# Patient Record
Sex: Female | Born: 1965 | Race: White | Hispanic: No | Marital: Married | Smoking: Former smoker
Health system: Southern US, Community
[De-identification: ages and names within clinical notes are randomized; demographics above are authoritative.]

## PROBLEM LIST (undated history)

## (undated) DIAGNOSIS — C44310 Basal cell carcinoma of skin of unspecified parts of face: Secondary | ICD-10-CM

## (undated) DIAGNOSIS — F419 Anxiety disorder, unspecified: Secondary | ICD-10-CM

## (undated) DIAGNOSIS — D649 Anemia, unspecified: Secondary | ICD-10-CM

## (undated) DIAGNOSIS — J189 Pneumonia, unspecified organism: Secondary | ICD-10-CM

## (undated) DIAGNOSIS — I219 Acute myocardial infarction, unspecified: Secondary | ICD-10-CM

## (undated) HISTORY — PX: POLYPECTOMY: SHX149

## (undated) HISTORY — DX: Anemia, unspecified: D64.9

## (undated) HISTORY — PX: OTHER SURGICAL HISTORY: SHX169

## (undated) HISTORY — DX: Pneumonia, unspecified organism: J18.9

## (undated) HISTORY — DX: Anxiety disorder, unspecified: F41.9

## (undated) HISTORY — PX: ENDOMETRIAL ABLATION W/ NOVASURE: SUR434

## (undated) HISTORY — DX: Acute myocardial infarction, unspecified: I21.9

## (undated) HISTORY — PX: DILATATION & CURRETTAGE/HYSTEROSCOPY WITH RESECTOCOPE: SHX5572

## (undated) HISTORY — DX: Basal cell carcinoma of skin of unspecified parts of face: C44.310

---

## 1985-04-14 HISTORY — PX: BRAIN SURGERY: SHX531

## 1998-07-06 ENCOUNTER — Other Ambulatory Visit: Admission: RE | Admit: 1998-07-06 | Discharge: 1998-07-06 | Payer: Self-pay | Admitting: Gynecology

## 1999-11-20 ENCOUNTER — Ambulatory Visit: Admission: RE | Admit: 1999-11-20 | Discharge: 1999-11-20 | Payer: Self-pay | Admitting: Internal Medicine

## 2000-02-03 ENCOUNTER — Other Ambulatory Visit: Admission: RE | Admit: 2000-02-03 | Discharge: 2000-02-03 | Payer: Self-pay | Admitting: Gynecology

## 2001-03-15 ENCOUNTER — Other Ambulatory Visit: Admission: RE | Admit: 2001-03-15 | Discharge: 2001-03-15 | Payer: Self-pay | Admitting: Gynecology

## 2003-04-25 ENCOUNTER — Other Ambulatory Visit: Admission: RE | Admit: 2003-04-25 | Discharge: 2003-04-25 | Payer: Self-pay | Admitting: Gynecology

## 2003-06-01 ENCOUNTER — Ambulatory Visit (HOSPITAL_BASED_OUTPATIENT_CLINIC_OR_DEPARTMENT_OTHER): Admission: RE | Admit: 2003-06-01 | Discharge: 2003-06-01 | Payer: Self-pay | Admitting: Gynecology

## 2003-06-01 ENCOUNTER — Ambulatory Visit (HOSPITAL_COMMUNITY): Admission: RE | Admit: 2003-06-01 | Discharge: 2003-06-01 | Payer: Self-pay | Admitting: Gynecology

## 2004-04-30 ENCOUNTER — Other Ambulatory Visit: Admission: RE | Admit: 2004-04-30 | Discharge: 2004-04-30 | Payer: Self-pay | Admitting: Gynecology

## 2005-05-01 ENCOUNTER — Other Ambulatory Visit: Admission: RE | Admit: 2005-05-01 | Discharge: 2005-05-01 | Payer: Self-pay | Admitting: Gynecology

## 2014-12-18 ENCOUNTER — Ambulatory Visit (INDEPENDENT_AMBULATORY_CARE_PROVIDER_SITE_OTHER): Payer: Self-pay | Admitting: Family Medicine

## 2014-12-18 ENCOUNTER — Ambulatory Visit (INDEPENDENT_AMBULATORY_CARE_PROVIDER_SITE_OTHER): Payer: Self-pay

## 2014-12-18 VITALS — BP 122/78 | HR 89 | Temp 98.2°F | Resp 14 | Ht 65.0 in | Wt 189.0 lb

## 2014-12-18 DIAGNOSIS — M25572 Pain in left ankle and joints of left foot: Secondary | ICD-10-CM

## 2014-12-18 DIAGNOSIS — S92302A Fracture of unspecified metatarsal bone(s), left foot, initial encounter for closed fracture: Secondary | ICD-10-CM

## 2014-12-18 NOTE — Patient Instructions (Addendum)
Wear cam walker  Take ibuprofen or Aleve if needed for pain  Elevate foot if any swelling is occurring  Use crutches  Referral is being made to orthopedics. You should hear from this some time by midweek.  Advise a follow-up bone density sometime in the coming months  Return if further pain or problems

## 2014-12-18 NOTE — Progress Notes (Signed)
Left foot pain Subjective:  Patient ID: Shawna Frost, female    DOB: [birth year]  Age: 49 y.o. MRN: [id number]  For the past 2 weeks this lady has been having pain in the left foot distal old second and third metatarsal area. Knows of no specific injury. Wonders whether she might have a stress fracture, since she had one some time ago on the right foot. She spends her time chasing a 52-year-old. She is a massage therapist. Has been wearing a Cam Walker much of the time the last 2 weeks.   Objective:   No acute distress. Foot appears grossly normal. Is tender in the distal second and third metatarsals left foot. Some pain also on palpation underneath the ball of the foot.  Assessment & Plan:   Assessment:  Foot pain  Plan:  X-ray left foot  [facility] reading (PRIMARY) by  Dr. Linna Darner Fracture second left metatarsal , slightly out of alignment  Refer to ortho..  Cam walker short    Patient Instructions  Wear cam walker  Take ibuprofen or Aleve if needed for pain  Elevate foot if any swelling is occurring  Use crutches  Referral is being made to orthopedics. You should hear from this some time by midweek.  Advise a follow-up bone density sometime in the coming months  Return if further pain or problems     HOPPER,DAVID, MD 12/18/2014

## 2015-08-22 ENCOUNTER — Encounter: Payer: Self-pay | Admitting: Gastroenterology

## 2015-10-25 ENCOUNTER — Ambulatory Visit (AMBULATORY_SURGERY_CENTER): Payer: Self-pay | Admitting: *Deleted

## 2015-10-25 VITALS — Ht 65.0 in | Wt 194.8 lb

## 2015-10-25 DIAGNOSIS — Z1211 Encounter for screening for malignant neoplasm of colon: Secondary | ICD-10-CM

## 2015-10-25 MED ORDER — SUPREP BOWEL PREP KIT 17.5-3.13-1.6 GM/177ML PO SOLN: 1.0000 | Freq: Once | ORAL | Status: DC

## 2015-10-25 NOTE — Progress Notes (Signed)
Patient denies any allergies to egg or soy products. Patient denies complications with anesthesia/sedation.  Patient denies oxygen use at home and denies diet medications. Emmi instructions for colonoscopy explained but patient denied.  Denied pamphlet.

## 2015-11-08 ENCOUNTER — Encounter: Payer: Self-pay | Admitting: Gastroenterology

## 2015-11-08 ENCOUNTER — Ambulatory Visit (AMBULATORY_SURGERY_CENTER): Payer: Self-pay | Admitting: Gastroenterology

## 2015-11-08 VITALS — BP 126/69 | HR 76 | Temp 98.4°F | Resp 16 | Ht 65.0 in | Wt 194.0 lb

## 2015-11-08 DIAGNOSIS — Z1211 Encounter for screening for malignant neoplasm of colon: Secondary | ICD-10-CM

## 2015-11-08 NOTE — Progress Notes (Signed)
A and O x3. Report to RN. Tolerated MAC anesthesia well. 

## 2015-11-08 NOTE — Op Note (Signed)
[facility] Patient Name: Shawna Frost Procedure Date: 11/08/2015 9:32 AM MRN: [id number] Endoscopist: Mauri Pole , MD Age: 50 Referring MD:  Date of Birth: Aug 13, 1965 Gender: Female Account #: [id number] Procedure:                Colonoscopy Indications:              Screening for colorectal malignant neoplasm, This                            is the patient's first colonoscopy Medicines:                Monitored Anesthesia Care Procedure:                Pre-Anesthesia Assessment:                           - Prior to the procedure, a History and Physical                            was performed, and patient medications and                            allergies were reviewed. The patient's tolerance of                            previous anesthesia was also reviewed. The risks                            and benefits of the procedure and the sedation                            options and risks were discussed with the patient.                            All questions were answered, and informed consent                            was obtained. Prior Anticoagulants: The patient has                            taken no previous anticoagulant or antiplatelet                            agents. ASA Grade Assessment: II - A patient with                            mild systemic disease. After reviewing the risks                            and benefits, the patient was deemed in                            satisfactory condition to undergo the procedure.  After obtaining informed consent, the colonoscope                            was passed under direct vision. Throughout the                            procedure, the patient's blood pressure, pulse, and                            oxygen saturations were monitored continuously. The                            Model CF-HQ190L [id number] scope was introduced                            through the anus  and advanced to the the terminal                            ileum, with identification of the appendiceal                            orifice and IC valve. The colonoscopy was performed                            without difficulty. The patient tolerated the                            procedure well. The quality of the bowel                            preparation was good. The terminal ileum, ileocecal                            valve, appendiceal orifice, and rectum were                            photographed. Scope In: 9:50:26 AM Scope Out: 10:00:27 AM Scope Withdrawal Time: 0 hours 5 minutes 11 seconds  Total Procedure Duration: 0 hours 10 minutes 1 second  Findings:                 The perianal and digital rectal examinations were                            normal.                           A few small-mouthed diverticula were found in the                            sigmoid colon.                           Non-bleeding internal hemorrhoids were found during  retroflexion. The hemorrhoids were small.                           The exam was otherwise without abnormality. Complications:            No immediate complications. Estimated Blood Loss:     Estimated blood loss: none. Impression:               - Diverticulosis in the sigmoid colon.                           - Non-bleeding internal hemorrhoids.                           - The examination was otherwise normal.                           - No specimens collected. Recommendation:           - Patient has a contact number available for                            emergencies. The signs and symptoms of potential                            delayed complications were discussed with the                            patient. Return to normal activities tomorrow.                            Written discharge instructions were provided to the                            patient.                           - Resume  previous diet.                           - Continue present medications.                           - Repeat colonoscopy in 10 years for screening                            purposes. Mauri Pole, MD 11/08/2015 10:04:01 AM This report has been signed electronically.

## 2015-11-08 NOTE — Progress Notes (Signed)
Pt has a perforated septum and septum.  She put bacitracion ointment in her nose this am. maw

## 2015-11-08 NOTE — Patient Instructions (Signed)
YOU HAD AN ENDOSCOPIC PROCEDURE TODAY AT THE [facility]:   Refer to the procedure report that was given to you for any specific questions about what was found during the examination.  If the procedure report does not answer your questions, please call your gastroenterologist to clarify.  If you requested that your care partner not be given the details of your procedure findings, then the procedure report has been included in a sealed envelope for you to review at your convenience later.  YOU SHOULD EXPECT: Some feelings of bloating in the abdomen. Passage of more gas than usual.  Walking can help get rid of the air that was put into your GI tract during the procedure and reduce the bloating. If you had a lower endoscopy (such as a colonoscopy or flexible sigmoidoscopy) you may notice spotting of blood in your stool or on the toilet paper. If you underwent a bowel prep for your procedure, you may not have a normal bowel movement for a few days.  Please Note:  You might notice some irritation and congestion in your nose or some drainage.  This is from the oxygen used during your procedure.  There is no need for concern and it should clear up in a day or so.  SYMPTOMS TO REPORT IMMEDIATELY:   Following lower endoscopy (colonoscopy or flexible sigmoidoscopy):  Excessive amounts of blood in the stool  Significant tenderness or worsening of abdominal pains  Swelling of the abdomen that is new, acute  Fever of 100F or higher   For urgent or emergent issues, a gastroenterologist can be reached at any hour by calling [phone].   DIET: Your first meal following the procedure should be a small meal and then it is ok to progress to your normal diet. Heavy or fried foods are harder to digest and may make you feel nauseous or bloated.  Likewise, meals heavy in dairy and vegetables can increase bloating.  Drink plenty of fluids but you should avoid alcoholic beverages for 24  hours.  ACTIVITY:  You should plan to take it easy for the rest of today and you should NOT DRIVE or use heavy machinery until tomorrow (because of the sedation medicines used during the test).    FOLLOW UP: Our staff will call the number listed on your records the next business day following your procedure to check on you and address any questions or concerns that you may have regarding the information given to you following your procedure. If we do not reach you, we will leave a message.  However, if you are feeling well and you are not experiencing any problems, there is no need to return our call.  We will assume that you have returned to your regular daily activities without incident.  If any biopsies were taken you will be contacted by phone or by letter within the next 1-3 weeks.  Please call us at [phone] if you have not heard about the biopsies in 3 weeks.    SIGNATURES/CONFIDENTIALITY: You and/or your care partner have signed paperwork which will be entered into your electronic medical record.  These signatures attest to the fact that that the information above on your After Visit Summary has been reviewed and is understood.  Full responsibility of the confidentiality of this discharge information lies with you and/or your care-partner. 

## 2015-11-09 ENCOUNTER — Telehealth: Payer: Self-pay | Admitting: *Deleted

## 2015-11-09 NOTE — Telephone Encounter (Signed)
  Follow up Call-  Call back number 11/08/2015  Post procedure Call Back phone  # [phone] cell  Permission to leave phone message Yes  Some recent data might be hidden     Patient questions:  Do you have a fever, pain , or abdominal swelling? No. Pain Score  0 *  Have you tolerated food without any problems? Yes.    Have you been able to return to your normal activities? Yes.    Do you have any questions about your discharge instructions: Diet   No. Medications  No. Follow up visit  No.  Do you have questions or concerns about your Care? No.  Actions: * If pain score is 4 or above: No action needed, pain <4.

## 2016-07-29 IMAGING — CR DG FOOT COMPLETE 3+V*L*
3 series · 3 of 3 positions shown · non-contrast
Comparison: None.

CLINICAL DATA: Left foot pain for the past 2 weeks. No known
injury.

EXAM:
LEFT FOOT - COMPLETE 3+ VIEW

[AP]
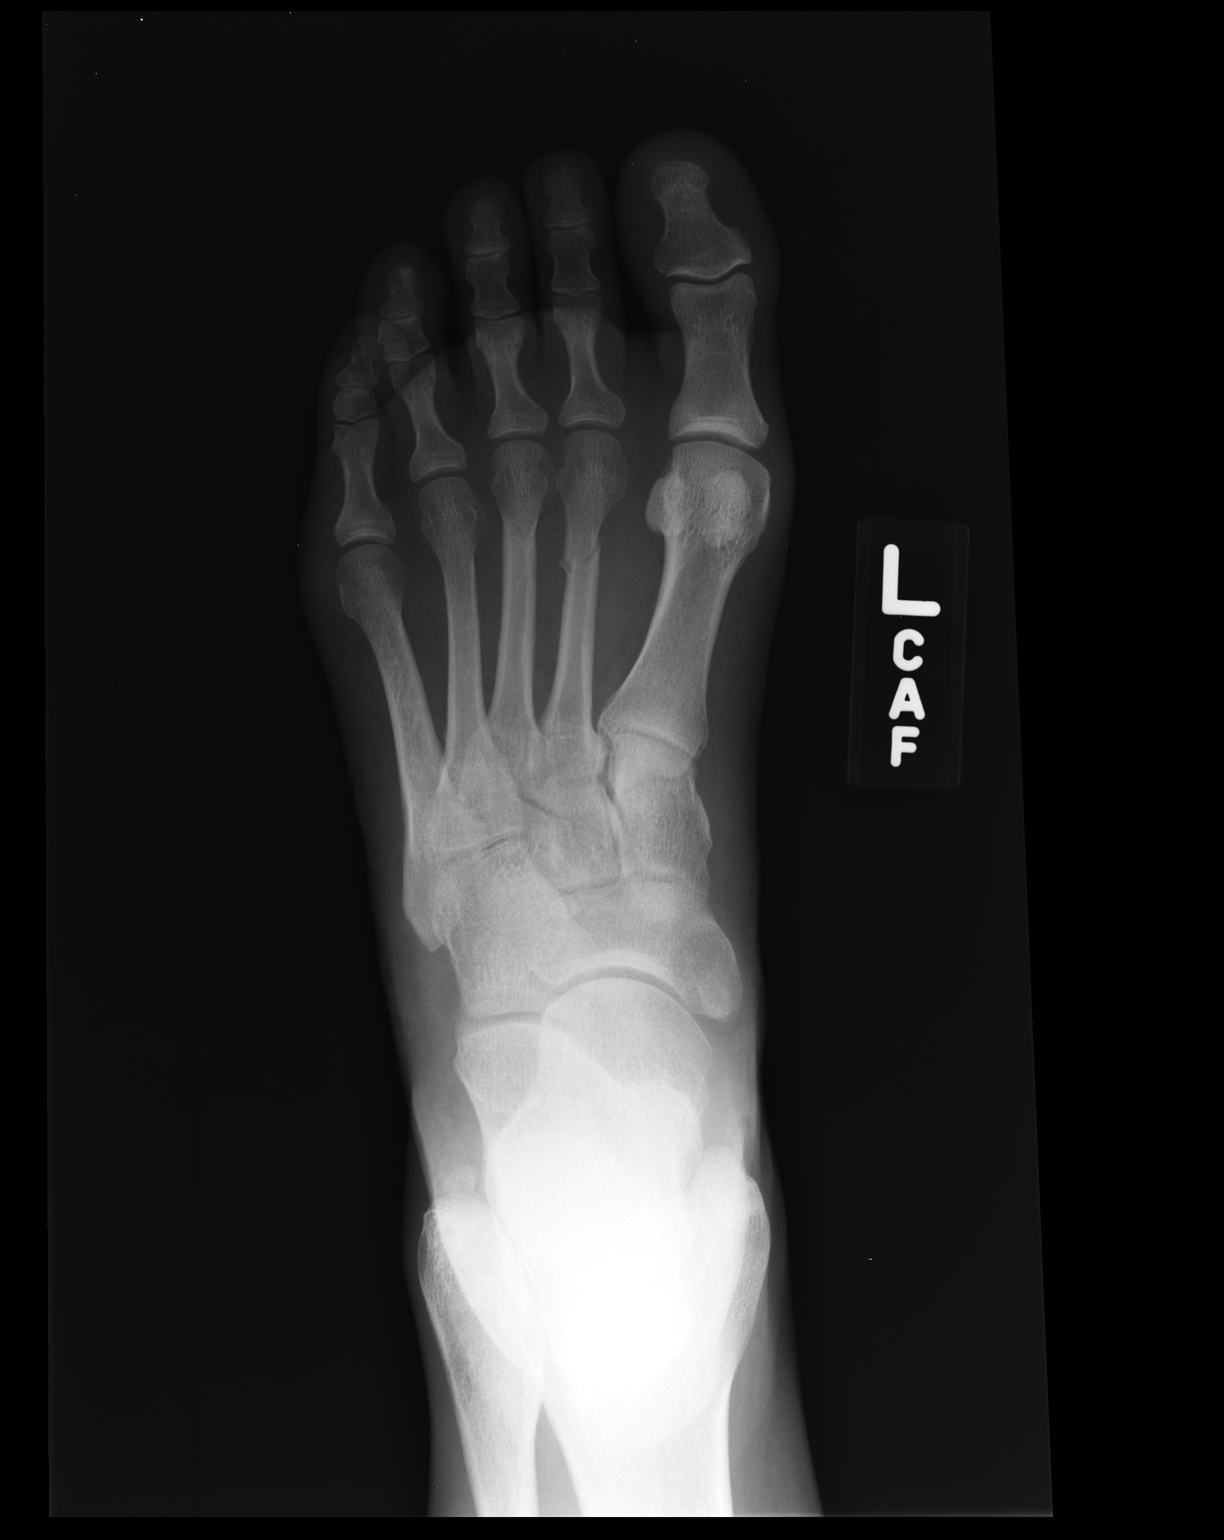

[ap obl int rot]
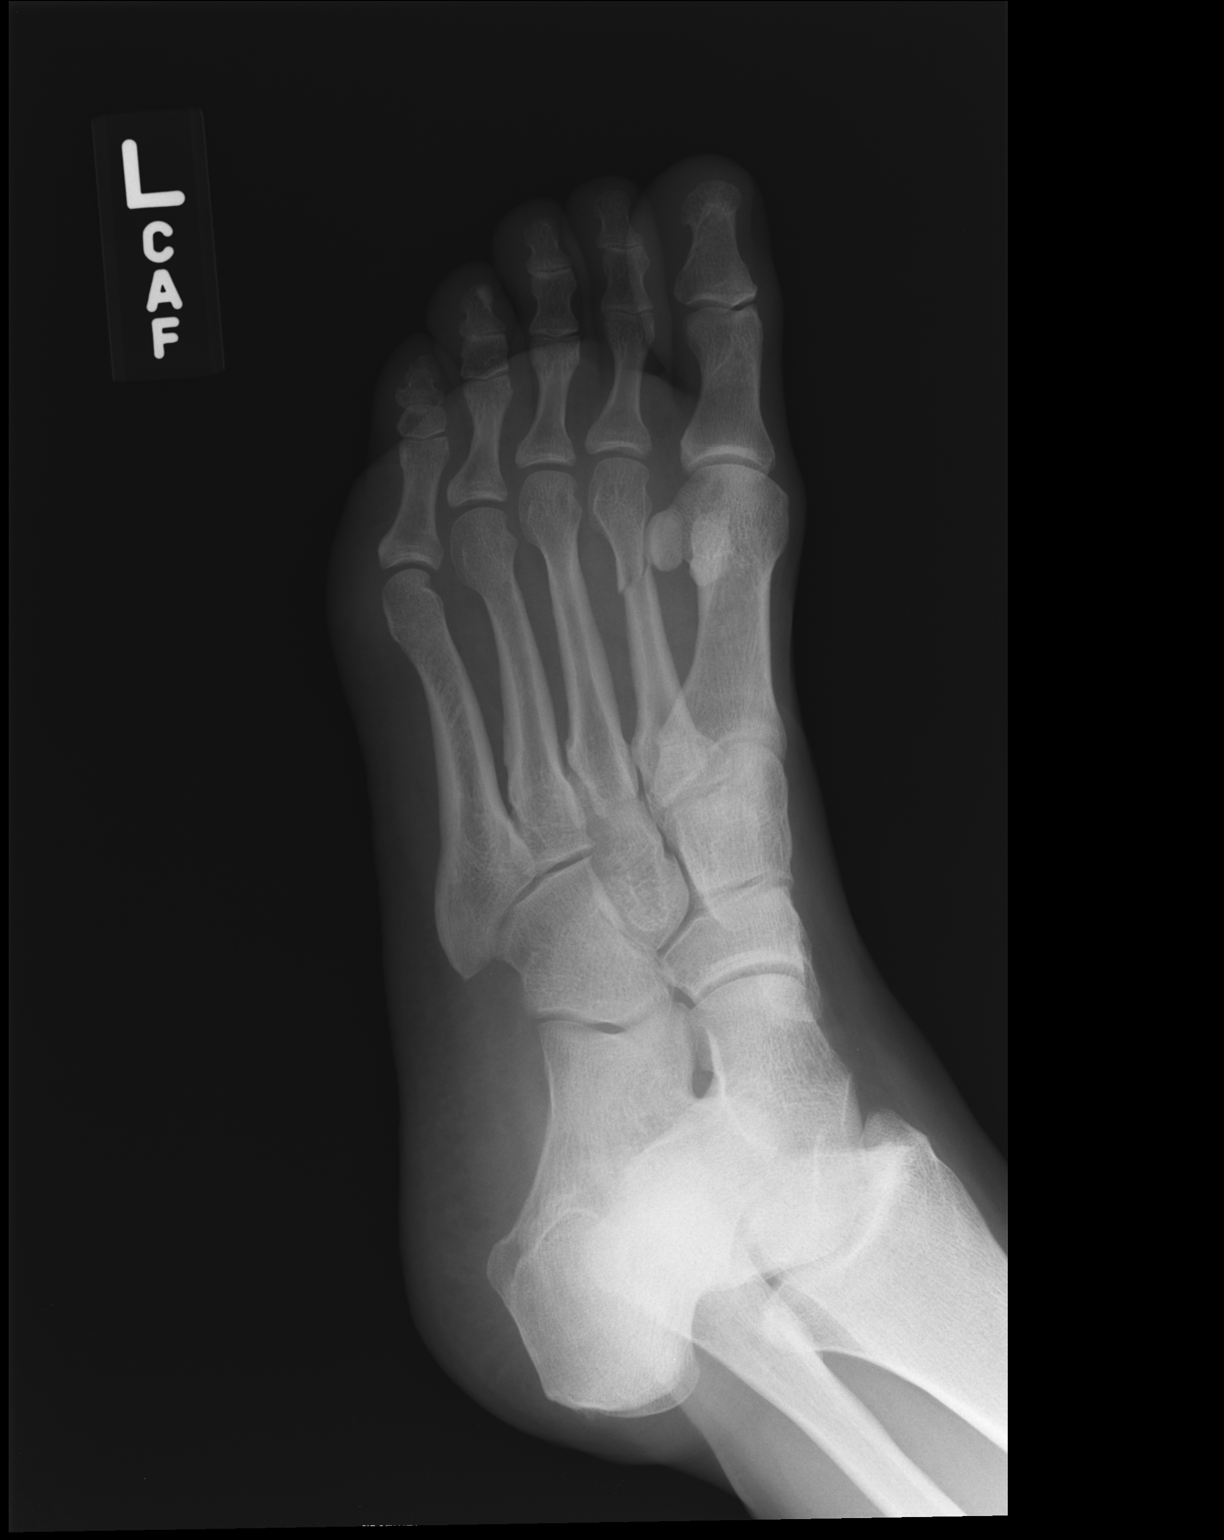

[lateral]
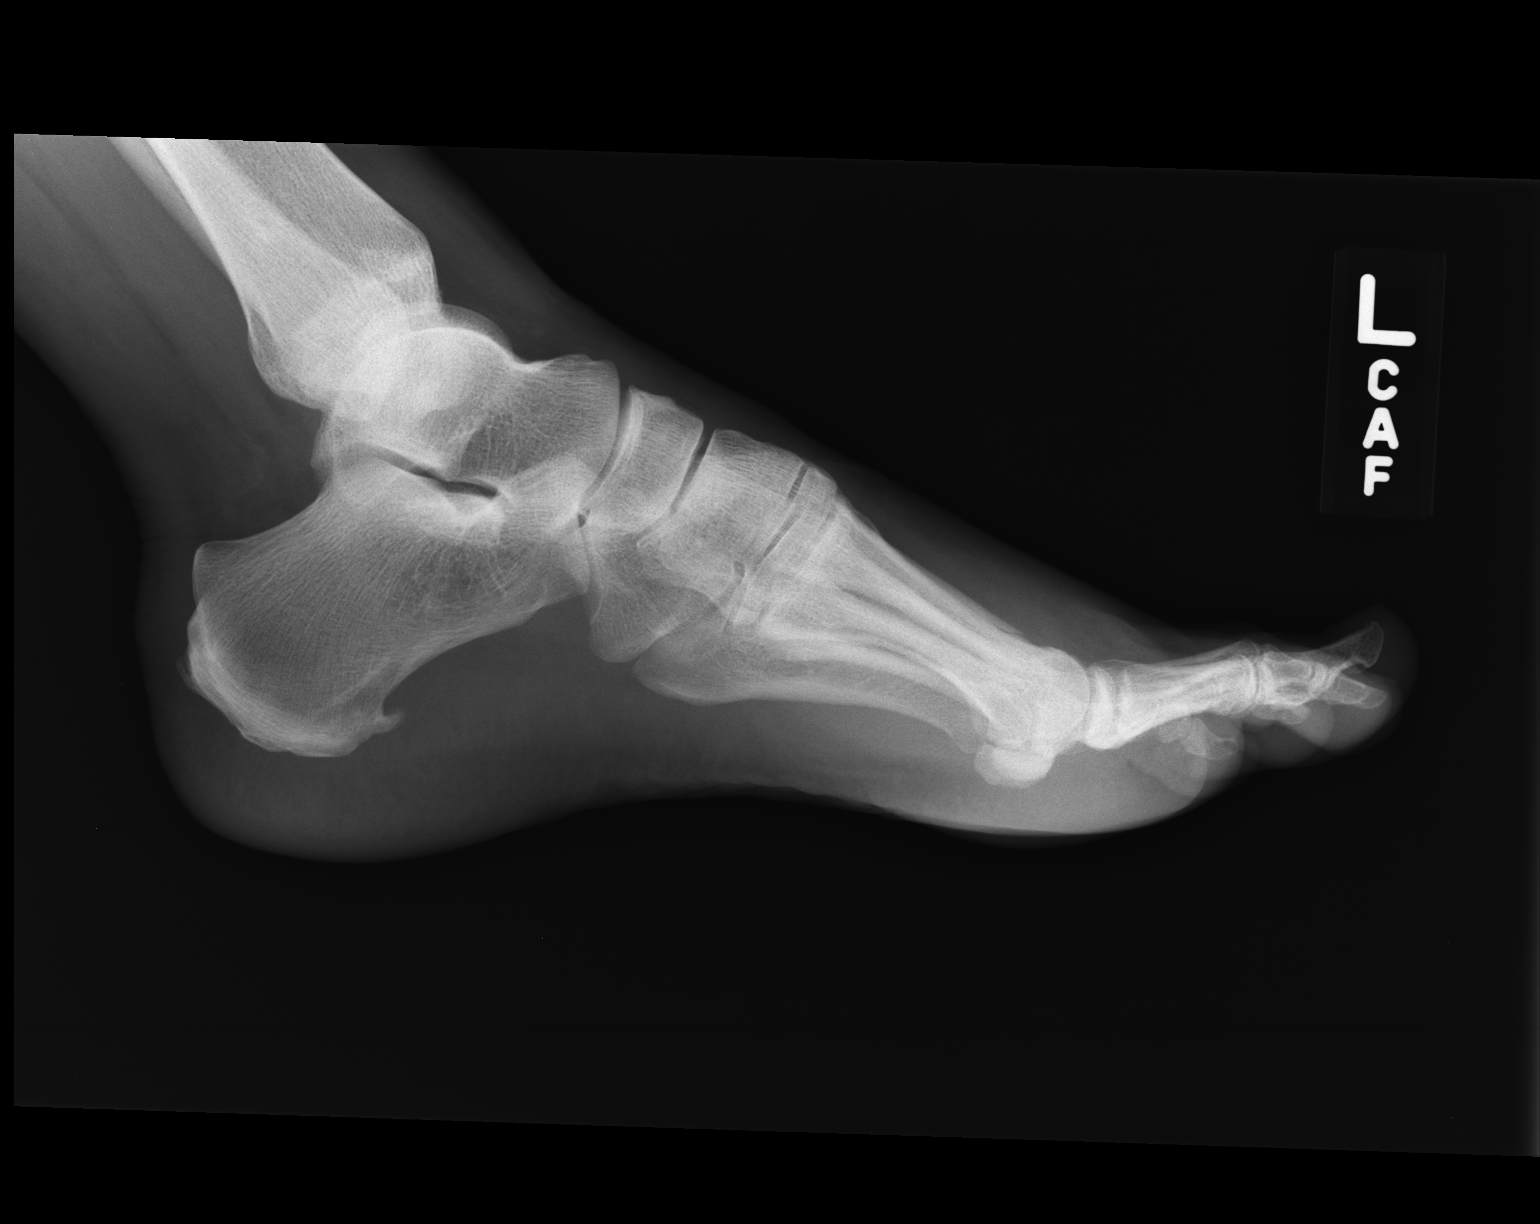

[3 of 3 positions shown; findings below may reference images not displayed]

FINDINGS: Oblique fracture of the distal shaft of the second metatarsal. This
is mildly comminuted with minimal lateral and ventral displacement
of the distal fragment without significant angulation.
Moderate-sized inferior calcaneal spur.
IMPRESSION: Minimally displaced and mildly comminuted second metatarsal
fracture.

## 2016-09-15 ENCOUNTER — Ambulatory Visit (HOSPITAL_COMMUNITY)
Admission: EM | Admit: 2016-09-15 | Discharge: 2016-09-15 | Disposition: A | Discharge status: Other Acute Inpatient Hospital | Payer: Self-pay

## 2020-01-08 ENCOUNTER — Other Ambulatory Visit: Payer: Self-pay | Admitting: Adult Health

## 2020-01-08 ENCOUNTER — Ambulatory Visit (HOSPITAL_COMMUNITY)
Admission: RE | Admit: 2020-01-08 | Discharge: 2020-01-08 | Disposition: A | Discharge status: Home / Self Care | Payer: HRSA Program | Source: Ambulatory Visit | Attending: Pulmonary Disease | Admitting: Pulmonary Disease

## 2020-01-08 DIAGNOSIS — U071 COVID-19: Secondary | ICD-10-CM

## 2020-01-08 MED ORDER — ALBUTEROL SULFATE HFA 108 (90 BASE) MCG/ACT IN AERS: 2.0000 | INHALATION_SPRAY | Freq: Once | RESPIRATORY_TRACT | Status: DC | PRN

## 2020-01-08 MED ORDER — EPINEPHRINE 0.3 MG/0.3ML IJ SOAJ: 0.3000 mg | Freq: Once | INTRAMUSCULAR | Status: DC | PRN

## 2020-01-08 MED ORDER — SODIUM CHLORIDE 0.9 % IV SOLN
1200.0000 mg | Freq: Once | INTRAVENOUS | Status: AC
  Administered 2020-01-08: 1200 mg via INTRAVENOUS

## 2020-01-08 MED ORDER — DIPHENHYDRAMINE HCL 50 MG/ML IJ SOLN: 50.0000 mg | Freq: Once | INTRAMUSCULAR | Status: DC | PRN

## 2020-01-08 MED ORDER — METHYLPREDNISOLONE SODIUM SUCC 125 MG IJ SOLR: 125.0000 mg | Freq: Once | INTRAMUSCULAR | Status: DC | PRN

## 2020-01-08 MED ORDER — SODIUM CHLORIDE 0.9 % IV SOLN: INTRAVENOUS | Status: DC | PRN

## 2020-01-08 MED ORDER — FAMOTIDINE IN NACL 20-0.9 MG/50ML-% IV SOLN: 20.0000 mg | Freq: Once | INTRAVENOUS | Status: DC | PRN

## 2020-01-08 NOTE — Discharge Instructions (Signed)

## 2020-01-08 NOTE — Progress Notes (Signed)
  Diagnosis: COVID-19  Physician: Dr. Joya Gaskins  Procedure: [facility] Med: casirivimab\imdevimab infusion - Provided patient with casirivimab\imdevimab fact sheet for patients, parents and caregivers prior to infusion.  Complications: No immediate complications noted.  Discharge: Discharged home   Tia Masker 01/08/2020

## 2020-01-08 NOTE — Progress Notes (Signed)
I connected by phone with Shawna Frost on 01/08/2020 at 9:28 AM to discuss the potential use of a new treatment for mild to moderate COVID-19 viral infection in non-hospitalized patients.  This patient is a 54 y.o. female that meets the FDA criteria for Emergency Use Authorization of COVID monoclonal antibody casirivimab/imdevimab or bamlanivimab/eteseviamb.  Has a (+) direct SARS-CoV-2 viral test result  Has mild or moderate COVID-19   Is NOT hospitalized due to COVID-19  Is within 10 days of symptom onset  Has at least one of the high risk factor(s) for progression to severe COVID-19 and/or hospitalization as defined in EUA.  Specific high risk criteria : BMI > 25   I have spoken and communicated the following to the patient or parent/caregiver regarding COVID monoclonal antibody treatment:  1. FDA has authorized the emergency use for the treatment of mild to moderate COVID-19 in adults and pediatric patients with positive results of direct SARS-CoV-2 viral testing who are 73 years of age and older weighing at least 40 kg, and who are at high risk for progressing to severe COVID-19 and/or hospitalization.  2. The significant known and potential risks and benefits of COVID monoclonal antibody, and the extent to which such potential risks and benefits are unknown.  3. Information on available alternative treatments and the risks and benefits of those alternatives, including clinical trials.  4. Patients treated with COVID monoclonal antibody should continue to self-isolate and use infection control measures (e.g., wear mask, isolate, social distance, avoid sharing personal items, clean and disinfect "high touch" surfaces, and frequent handwashing) according to CDC guidelines.   5. The patient or parent/caregiver has the option to accept or refuse COVID monoclonal antibody treatment.  After reviewing this information with the patient, the patient has agreed to receive one of the  available covid 19 monoclonal antibodies and will be provided an appropriate fact sheet prior to infusion.  Set up for 9/26 at 1230  Will bring copy of test results   Rexene Edison, NP 01/08/2020 9:28 AM

## 2021-05-21 NOTE — Progress Notes (Signed)
° °  ARIELE VIDRIO 1965/12/20 [id number]   History: Postmenopausal 56 y.o. G 2P0 presents for annual exam.  Gynecologic History Sexually active: yes Hormone replacement therapy: no Symptoms controlled: yes  Health Maintenance Last Pap: 2018. Results were: normal  Last mammogram: 2022. Results were:normal  Last colonoscopy: 2016. Results were: normal Last Dexa: never  Past medical history, past surgical history, family history and social history were all reviewed and documented in the EPIC chart.  ROS:  A ROS was performed and pertinent positives and negatives are included. Review of Systems - Negative except   Genito-Urinary ROS: positive for - vaginal dryness, decreased libido  Exam:  Vitals:   05/22/21 1124  BP: 136/84  Weight: 174 lb (78.9 kg)  Height: 5' 4.5" (1.638 m)   Body mass index is 29.41 kg/m.  General appearance:  Normal Thyroid:  Symmetrical, normal in size, without palpable masses or nodularity. Respiratory  Auscultation:  Clear without wheezing or rhonchi Cardiovascular  Auscultation:  Regular rate, without rubs, murmurs or gallops  Edema/varicosities:  Not grossly evident Abdominal  Soft,nontender, without masses, guarding or rebound.  Liver/spleen:  No organomegaly noted  Hernia:  None appreciated  Skin  Inspection:  Grossly normal Breasts: Examined lying and sitting.   Right: Without masses, retractions, nipple discharge or axillary adenopathy.   Left: Without masses, retractions, nipple discharge or axillary adenopathy. Genitourinary   Inguinal/mons:  Normal without inguinal adenopathy  External genitalia:  Normal appearing vulva with no masses, tenderness, or lesions  BUS/Urethra/Skene's glands:  Normal  Vagina:  Normal appearing with normal color and discharge, no lesions. Atrophy: moderate Prolapse: none  Cervix:  Normal appearing without discharge or lesions  Uterus:  Normal in size, shape and contour.  Midline and mobile,  nontender  Adnexa/parametria:     Rt: Normal in size, without masses or tenderness.   Lt: Normal in size, without masses or tenderness.  Anus and perineum: Normal  Digital rectal exam: Normal sphincter tone without palpated masses or tenderness  Patient informed chaperone available to be present for breast and pelvic exam. Patient has requested no chaperone to be present. Patient has been advised what will be completed during breast and pelvic exam.   Assessment/Plan:  56 y.o. for annual exam.   Schedule yearly mammo Pap with cotesting today Vaginal atrophy- begin yuvafem twice/week at bedtime Schedule DEXA  Discussed SBE, colonoscopy and DEXA screening as directed. Recommend 149mins of exercise weekly, including weight bearing exercise. Encouraged the use of seatbelts and sunscreen.  Return in 1 year for annual or sooner prn.  Kerry Dory WHNP-BC, 11:57 AM 05/22/2021

## 2021-05-22 ENCOUNTER — Encounter: Payer: Self-pay | Admitting: Radiology

## 2021-05-22 ENCOUNTER — Ambulatory Visit (INDEPENDENT_AMBULATORY_CARE_PROVIDER_SITE_OTHER): Payer: Self-pay | Admitting: Radiology

## 2021-05-22 ENCOUNTER — Other Ambulatory Visit (HOSPITAL_COMMUNITY)
Admission: RE | Admit: 2021-05-22 | Discharge: 2021-05-22 | Disposition: A | Discharge status: Home / Self Care | Payer: Self-pay | Source: Ambulatory Visit | Attending: Radiology | Admitting: Radiology

## 2021-05-22 ENCOUNTER — Other Ambulatory Visit: Payer: Self-pay

## 2021-05-22 VITALS — BP 136/84 | Ht 64.5 in | Wt 174.0 lb

## 2021-05-22 DIAGNOSIS — Z01419 Encounter for gynecological examination (general) (routine) without abnormal findings: Secondary | ICD-10-CM | POA: Insufficient documentation

## 2021-05-22 DIAGNOSIS — N952 Postmenopausal atrophic vaginitis: Secondary | ICD-10-CM

## 2021-05-22 MED ORDER — ESTRADIOL 10 MCG VA TABS: 1.0000 | ORAL_TABLET | VAGINAL | 11 refills | Status: AC

## 2021-05-23 ENCOUNTER — Other Ambulatory Visit: Payer: Self-pay | Admitting: *Deleted

## 2021-05-23 DIAGNOSIS — Z13820 Encounter for screening for osteoporosis: Secondary | ICD-10-CM

## 2021-05-24 LAB — CYTOLOGY - PAP
Comment: NEGATIVE
Diagnosis: NEGATIVE
Diagnosis: REACTIVE
High risk HPV: NEGATIVE

## 2021-07-08 ENCOUNTER — Other Ambulatory Visit: Payer: Self-pay | Admitting: Radiology

## 2021-07-08 ENCOUNTER — Other Ambulatory Visit: Payer: Self-pay

## 2021-07-08 ENCOUNTER — Ambulatory Visit (INDEPENDENT_AMBULATORY_CARE_PROVIDER_SITE_OTHER): Payer: Self-pay

## 2021-07-08 DIAGNOSIS — Z780 Asymptomatic menopausal state: Secondary | ICD-10-CM

## 2021-07-08 DIAGNOSIS — Z13820 Encounter for screening for osteoporosis: Secondary | ICD-10-CM

## 2021-08-16 ENCOUNTER — Encounter: Payer: Self-pay | Admitting: Radiology

## 2021-10-17 ENCOUNTER — Telehealth: Payer: Self-pay

## 2021-10-17 NOTE — Telephone Encounter (Signed)
Pt calling to report that she has recently been informed about a medication called "naltrexone" and is interested to see if it would work for her. Pt is wondering if you would be willing to Rx her 1-2 months of it to see. Please advise.   Last AEX 05/22/21

## 2021-10-17 NOTE — Telephone Encounter (Signed)
For alcohol abuse? This would need to come from her PCP

## 2021-10-17 NOTE — Telephone Encounter (Signed)
Returned call, no answer. Left detailed VM per DPR re: recommendations. Noted pt does not have PCP listed in chart. Offered to send her a referral to [facility] on [address] if needed. Asked her to return call and let us know.

## 2021-11-01 NOTE — Telephone Encounter (Signed)
No returned call. OK to close?

## 2021-11-01 NOTE — Telephone Encounter (Signed)
Ok to close

## 2022-08-25 ENCOUNTER — Encounter: Payer: Self-pay | Admitting: Radiology

## 2024-02-03 ENCOUNTER — Other Ambulatory Visit: Payer: Self-pay | Admitting: Obstetrics and Gynecology

## 2024-02-03 DIAGNOSIS — Z1231 Encounter for screening mammogram for malignant neoplasm of breast: Secondary | ICD-10-CM

## 2024-03-31 ENCOUNTER — Inpatient Hospital Stay: Admission: RE | Admit: 2024-03-31 | Payer: Self-pay

## 2024-03-31 ENCOUNTER — Other Ambulatory Visit: Payer: Self-pay

## 2024-03-31 ENCOUNTER — Ambulatory Visit: Payer: Self-pay | Admitting: *Deleted

## 2024-03-31 VITALS — BP 149/84 | Ht 64.5 in | Wt 172.0 lb

## 2024-03-31 DIAGNOSIS — Z1239 Encounter for other screening for malignant neoplasm of breast: Secondary | ICD-10-CM

## 2024-03-31 DIAGNOSIS — Z1211 Encounter for screening for malignant neoplasm of colon: Secondary | ICD-10-CM

## 2024-03-31 DIAGNOSIS — Z1231 Encounter for screening mammogram for malignant neoplasm of breast: Secondary | ICD-10-CM

## 2024-03-31 NOTE — Progress Notes (Signed)
 Ms. Shawna Frost is a 58 y.o. female who presents to [facility] clinic today with no complaints.    Pap Smear: Pap smear not completed today. Last Pap smear was 05/22/2021 at [facility] and was normal with negative HPV. Per patient has history of an abnormal Pap smear 30 years ago that a follow up Pap smear was completed that was normal. Patient stated all Pap smears have been normal since. Last Pap smear result is available in Epic.   Physical exam: Breasts Breasts symmetrical. No skin abnormalities bilateral breasts. No nipple retraction bilateral breasts. No nipple discharge bilateral breasts. No lymphadenopathy. No lumps palpated bilateral breasts. No complaints of pain or tenderness on exam.  Pelvic/Bimanual Pap is not indicated today per BCCCP guidelines.   Smoking History: Patient is a former smoker that smoked socially for 2-3 years and quit 40 years ago.   Patient Navigation: Patient education provided. Access to services provided for patient through [facility] program.   Colorectal Cancer Screening: Per patient has had colonoscopy completed on 11/08/2015 at [facility]. FIT Test given to patient to complete. No complaints today.    Breast and Cervical Cancer Risk Assessment: Patient has family history of her daughter having breast cancer. Patient has no known genetic mutations or history of radiation treatment to the chest before age 61. Patient does not have history of cervical dysplasia, immunocompromised, or DES exposure in-utero.  Risk Scores as of Encounter on 03/31/2024     Alisa           5-year 1.36%   Lifetime 7.38%            Last calculated by Silas, Ansyi K, CMA on 03/31/2024 at  1:01 PM        A: BCCCP exam without pap smear No complaints.  P: Referred patient to the [facility] for a screening mammogram. Appointment scheduled Thursday, March 31, 2024 at 1400.  Driscilla Wanda SQUIBB, RN 03/31/2024 12:56  PM

## 2024-03-31 NOTE — Patient Instructions (Signed)
 Explained breast self awareness with Suzen CHRISTELLA Collum. Patient did not need a Pap smear today due to last Pap smear and HPV typing was 05/22/2021. Let her know BCCCP will cover Pap smears and HPV typing every 5 years unless has a history of abnormal Pap smears. Referred patient to the [facility] for a screening mammogram. Appointment scheduled Thursday, March 31, 2024 at 1400. Patient aware of appointment and will be there. Let patient know the Breast Center will follow up with her within the next couple weeks with results of her mammogram by letter or phone. Suzen CHRISTELLA Collum verbalized understanding.  Tawanda Schall, Wanda Ship, RN 12:56 PM

## 2024-04-13 ENCOUNTER — Ambulatory Visit: Payer: Self-pay

## 2024-04-13 LAB — FECAL OCCULT BLOOD, IMMUNOCHEMICAL: Fecal Occult Bld: NEGATIVE

## 2024-06-13 ENCOUNTER — Other Ambulatory Visit: Payer: Self-pay

## 2024-06-13 ENCOUNTER — Inpatient Hospital Stay
